# Patient Record
Sex: Male | Born: 1935 | Race: White | Hispanic: No | Marital: Married | Smoking: Never smoker
Health system: Southern US, Community
[De-identification: ages and names within clinical notes are randomized; demographics above are authoritative.]

## PROBLEM LIST (undated history)

## (undated) DIAGNOSIS — I2510 Atherosclerotic heart disease of native coronary artery without angina pectoris: Secondary | ICD-10-CM

## (undated) DIAGNOSIS — I10 Essential (primary) hypertension: Secondary | ICD-10-CM

## (undated) HISTORY — PX: REPLACEMENT TOTAL KNEE: SUR1224

## (undated) HISTORY — PX: OTHER SURGICAL HISTORY: SHX169

## (undated) HISTORY — PX: CARDIAC SURGERY: SHX584

## (undated) HISTORY — PX: KNEE ARTHROSCOPY: SUR90

---

## 2014-09-18 ENCOUNTER — Encounter (HOSPITAL_BASED_OUTPATIENT_CLINIC_OR_DEPARTMENT_OTHER): Payer: Self-pay | Admitting: *Deleted

## 2014-09-18 ENCOUNTER — Emergency Department (HOSPITAL_BASED_OUTPATIENT_CLINIC_OR_DEPARTMENT_OTHER)
Admission: EM | Admit: 2014-09-18 | Discharge: 2014-09-18 | Disposition: A | Discharge status: Other Acute Inpatient Hospital | Payer: Medicare Other | Attending: Emergency Medicine | Admitting: Emergency Medicine

## 2014-09-18 DIAGNOSIS — Y998 Other external cause status: Secondary | ICD-10-CM | POA: Diagnosis not present

## 2014-09-18 DIAGNOSIS — Z9889 Other specified postprocedural states: Secondary | ICD-10-CM | POA: Insufficient documentation

## 2014-09-18 DIAGNOSIS — R609 Edema, unspecified: Secondary | ICD-10-CM | POA: Insufficient documentation

## 2014-09-18 DIAGNOSIS — Y9289 Other specified places as the place of occurrence of the external cause: Secondary | ICD-10-CM | POA: Insufficient documentation

## 2014-09-18 DIAGNOSIS — R008 Other abnormalities of heart beat: Secondary | ICD-10-CM | POA: Diagnosis not present

## 2014-09-18 DIAGNOSIS — R0989 Other specified symptoms and signs involving the circulatory and respiratory systems: Secondary | ICD-10-CM | POA: Diagnosis present

## 2014-09-18 DIAGNOSIS — T18128A Food in esophagus causing other injury, initial encounter: Secondary | ICD-10-CM

## 2014-09-18 DIAGNOSIS — T18120A Food in esophagus causing compression of trachea, initial encounter: Secondary | ICD-10-CM | POA: Insufficient documentation

## 2014-09-18 DIAGNOSIS — I10 Essential (primary) hypertension: Secondary | ICD-10-CM | POA: Insufficient documentation

## 2014-09-18 DIAGNOSIS — K222 Esophageal obstruction: Secondary | ICD-10-CM

## 2014-09-18 DIAGNOSIS — X58XXXA Exposure to other specified factors, initial encounter: Secondary | ICD-10-CM | POA: Diagnosis not present

## 2014-09-18 DIAGNOSIS — Y9389 Activity, other specified: Secondary | ICD-10-CM | POA: Diagnosis not present

## 2014-09-18 DIAGNOSIS — I2510 Atherosclerotic heart disease of native coronary artery without angina pectoris: Secondary | ICD-10-CM | POA: Diagnosis not present

## 2014-09-18 HISTORY — DX: Atherosclerotic heart disease of native coronary artery without angina pectoris: I25.10

## 2014-09-18 HISTORY — DX: Essential (primary) hypertension: I10

## 2014-09-18 NOTE — ED Notes (Signed)
Patient states that he was eating and suddenly was unable to swallow. Son was with him and states that everything he tried to eat or drink would come back up, but he was not vomiting. Patient states that he also has SOB

## 2014-09-18 NOTE — ED Provider Notes (Signed)
CSN: [id number]     Arrival date & time 09/18/14  1428 History  This chart was scribed for Tilden FossaElizabeth Ulanda Tackett, MD by Roxy Cedarhandni Bhalodia, ED Scribe. This patient was seen in room MH09/MH09 and the patient's care was started at 3:19 PM.   Chief Complaint  Patient presents with  . Choking   Patient is a 80 y.o. male presenting with shortness of breath. The history is provided by the patient. No language interpreter was used.  Shortness of Breath Onset quality:  Sudden Timing:  Rare Progression:  Improving Chronicity:  New Context comment:  Choking Relieved by:  Nothing Worsened by:  Nothing tried Ineffective treatments:  None tried Associated symptoms: no vomiting    HPI Comments: Colin Keith is a 80 y.o. male with a history of hypertension and CAD, who presents to the Emergency Department complaining of sudden SOB and a choking episode that occurred earlier today at 1:30PM. Patient states that he was eating roast pork and macaroni and cheese and suddenly was unable to swallow his food. Patient's son with him during incident. Per son, patient was not able to keep anything he ate or drank down. He denies associated episodes of emesis. He states he feels like "something is stuck down in my throat near my chest". He also feels mild associated dizziness. He states that he has had similar episodes of choking in the past but states that his symptoms today are worse than usual. He states he currently takes Coumadin blood thinner medication.  Past Medical History  Diagnosis Date  . Hypertension   . Coronary artery disease    Past Surgical History  Procedure Laterality Date  . Stents    . Cardiac surgery    . Knee arthroscopy    . Replacement total knee     No family history on file. History  Substance Use Topics  . Smoking status: Never Smoker   . Smokeless tobacco: Not on file  . Alcohol Use: No   Review of Systems  Respiratory: Positive for choking and shortness of breath.    Gastrointestinal: Negative for vomiting.  All other systems reviewed and are negative.  Allergies  Review of patient's allergies indicates no known allergies.  Home Medications   Prior to Admission medications   Not on File   Triage Vitals: BP 152/92 mmHg  Pulse 79  Temp(Src) 97.7 F (36.5 C) (Oral)  Resp 24  Ht 5\' 6"  (1.676 m)  Wt 212 lb (96.163 kg)  BMI 34.23 kg/m2  SpO2 100%  Physical Exam  Constitutional: He is oriented to person, place, and time. He appears well-developed and well-nourished.  HENT:  Head: Normocephalic and atraumatic.  Cardiovascular: Normal rate and normal heart sounds.  An irregular rhythm present.  No murmur heard.    Pulmonary/Chest: Effort normal and breath sounds normal. No respiratory distress.  No respiratory distress noted.  Abdominal: Soft. Bowel sounds are normal. There is no tenderness. There is no rebound and no guarding.  Musculoskeletal: He exhibits edema. He exhibits no tenderness.  1+ edema in bilateral lower extremities  Neurological: He is alert and oriented to person, place, and time.  Skin: Skin is warm and dry.  Psychiatric: He has a normal mood and affect. His behavior is normal.  Nursing note and vitals reviewed.  ED Course  Procedures (including critical care time)  DIAGNOSTIC STUDIES: Oxygen Saturation is 100% on RA, normal by my interpretation.    COORDINATION OF CARE: 3:27 PM-  [facility]. Discussed plans [facility] patient request due to food impaction. Pt advised of plan for treatment and pt agrees.  Labs Review Labs Reviewed - No data to display  Imaging Review No results found.   EKG Interpretation None     MDM   Final diagnoses:  Esophageal obstruction due to food impaction    Patient here for evaluation of food stuck in throat. History is dictation consistent with esophageal food impaction. Patient without any respiratory  distress currently or history of recent respiratory distress. [facility]. Discussed with [facility] and ED [facility] who will see patient in transfer.  I personally performed the services described in this documentation, which was scribed in my presence. The recorded information has been reviewed and is accurate.  Tilden FossaElizabeth Phylisha Dix, MD 09/18/14 2250

## 2014-09-18 NOTE — ED Notes (Signed)
Pt asked MD if he could drank one more drank of Water before transferring. OK per MD. Pt given one sip of water and was unable to keep down. Pt also brady down to 30's VS returned back to normal. HR 78, RR 20, SPO2 100% on RA. No other complications noted. MD aware.

## 2017-09-19 ENCOUNTER — Emergency Department (HOSPITAL_BASED_OUTPATIENT_CLINIC_OR_DEPARTMENT_OTHER)
Admission: EM | Admit: 2017-09-19 | Discharge: 2017-09-20 | Disposition: A | Discharge status: Other Acute Inpatient Hospital | Payer: Medicare Other | Attending: Emergency Medicine | Admitting: Emergency Medicine

## 2017-09-19 ENCOUNTER — Emergency Department (HOSPITAL_BASED_OUTPATIENT_CLINIC_OR_DEPARTMENT_OTHER): Payer: Medicare Other

## 2017-09-19 ENCOUNTER — Encounter (HOSPITAL_BASED_OUTPATIENT_CLINIC_OR_DEPARTMENT_OTHER): Payer: Self-pay | Admitting: *Deleted

## 2017-09-19 ENCOUNTER — Other Ambulatory Visit: Payer: Self-pay

## 2017-09-19 DIAGNOSIS — Z79899 Other long term (current) drug therapy: Secondary | ICD-10-CM | POA: Diagnosis not present

## 2017-09-19 DIAGNOSIS — R0602 Shortness of breath: Secondary | ICD-10-CM | POA: Insufficient documentation

## 2017-09-19 DIAGNOSIS — Z7901 Long term (current) use of anticoagulants: Secondary | ICD-10-CM | POA: Insufficient documentation

## 2017-09-19 DIAGNOSIS — I4891 Unspecified atrial fibrillation: Secondary | ICD-10-CM | POA: Insufficient documentation

## 2017-09-19 DIAGNOSIS — I509 Heart failure, unspecified: Secondary | ICD-10-CM | POA: Diagnosis not present

## 2017-09-19 DIAGNOSIS — I2510 Atherosclerotic heart disease of native coronary artery without angina pectoris: Secondary | ICD-10-CM | POA: Diagnosis not present

## 2017-09-19 DIAGNOSIS — R2241 Localized swelling, mass and lump, right lower limb: Secondary | ICD-10-CM | POA: Diagnosis present

## 2017-09-19 DIAGNOSIS — I110 Hypertensive heart disease with heart failure: Secondary | ICD-10-CM | POA: Diagnosis not present

## 2017-09-19 LAB — BASIC METABOLIC PANEL
ANION GAP: 5 (ref 5–15)
BUN: 34 mg/dL — ABNORMAL HIGH (ref 6–20)
CHLORIDE: 98 mmol/L — AB (ref 101–111)
CO2: 33 mmol/L — AB (ref 22–32)
Calcium: 8.8 mg/dL — ABNORMAL LOW (ref 8.9–10.3)
Creatinine, Ser: 1.47 mg/dL — ABNORMAL HIGH (ref 0.61–1.24)
GFR calc non Af Amer: 40 mL/min — ABNORMAL LOW (ref >60)
GFR, EST AFRICAN AMERICAN: 46 mL/min — AB (ref >60)
GLUCOSE: 111 mg/dL — AB (ref 65–99)
Potassium: 4.1 mmol/L (ref 3.5–5.1)
Sodium: 136 mmol/L (ref 135–145)

## 2017-09-19 LAB — CBC WITH DIFFERENTIAL/PLATELET
Basophils Absolute: 0.1 10*3/uL (ref 0.0–0.1)
Basophils Relative: 2 %
Eosinophils Absolute: 0.1 10*3/uL (ref 0.0–0.7)
Eosinophils Relative: 3 %
HCT: 29.5 % — ABNORMAL LOW (ref 39.0–52.0)
Hemoglobin: 9.2 g/dL — ABNORMAL LOW (ref 13.0–17.0)
Lymphocytes Relative: 13 %
Lymphs Abs: 0.6 10*3/uL — ABNORMAL LOW (ref 0.7–4.0)
MCH: 28 pg (ref 26.0–34.0)
MCHC: 31.2 g/dL (ref 30.0–36.0)
MCV: 89.7 fL (ref 78.0–100.0)
MONO ABS: 0.4 10*3/uL (ref 0.1–1.0)
MONOS PCT: 10 %
NEUTROS ABS: 3.3 10*3/uL (ref 1.7–7.7)
NEUTROS PCT: 72 %
Platelets: 156 10*3/uL (ref 150–400)
RBC: 3.29 MIL/uL — ABNORMAL LOW (ref 4.22–5.81)
RDW: 16 % — AB (ref 11.5–15.5)
WBC: 4.5 10*3/uL (ref 4.0–10.5)

## 2017-09-19 LAB — BRAIN NATRIURETIC PEPTIDE: B Natriuretic Peptide: 343.7 pg/mL — ABNORMAL HIGH (ref 0.0–100.0)

## 2017-09-19 LAB — PROTIME-INR
INR: 3.33
Prothrombin Time: 33.5 seconds — ABNORMAL HIGH (ref 11.4–15.2)

## 2017-09-19 LAB — TROPONIN I: Troponin I: <0.03 ng/mL (ref <0.03)

## 2017-09-19 MED ORDER — FUROSEMIDE 10 MG/ML IJ SOLN
40.0000 mg | Freq: Once | INTRAMUSCULAR | Status: AC
  Administered 2017-09-19: 40 mg via INTRAVENOUS
  Filled 2017-09-19: qty 4

## 2017-09-19 NOTE — ED Triage Notes (Signed)
Right lower leg is swollen red and painful.

## 2017-09-19 NOTE — ED Notes (Addendum)
Attempted to call for report 3 times.  Phone just rang.   Phone number verified with Diplomatic Services operational officersecretary.    One staff finally answered the phone and she stated that the number I called was a patient's room [phone]).  She gave me the right number. [phone].

## 2017-09-19 NOTE — ED Provider Notes (Signed)
Medical screening examination/treatment/procedure(s) were conducted as a shared visit with non-physician practitioner(s) and myself.  I personally evaluated the patient during the encounter.   EKG Interpretation  Date/Time:  Friday September 19 2017 18:21:40 EST Ventricular Rate:  68 PR Interval:    QRS Duration: 91 QT Interval:  387 QTC Calculation: 412 R Axis:   35 Text Interpretation:  Atrial fibrillation Anterior infarct, old no prior EKG  Confirmed by Crista CurbLiu, Mumtaz Lovins [phone] on 09/19/2017 11:07:7339 PM      83 year old male with history of CAD, chronic atrial fibrillation on Coumadin with right lower leg swelling.  Symptoms have been ongoing for a few days, with swelling right worse than left.  Yesterday noticed difficulty falling asleep at night when he laid back after waking up to use the restroom.  He has not had chest pain, fevers, nausea or vomiting, dyspnea on exertion or fatigue.  Patient is nontoxic in no acute distress.  Does have hypoxia 87-91% on room air.  With right greater than left lower extremity edema.  Doubt DVT given INR 3.33.  Ultrasound of the lower extremities were obtained, and negative for DVT. Given O2 requirement concerned for CHF. BNP mildly elevated. Negative troponin. No ischemic EKG changes. CXR visualized and shows interstitial pulmonary edema.  He is given Lasix.  Given new oxygen requirement in the ED patient will be admitted for CHF.  [facility].  Accepted for transfer and admission.   Lavera GuiseLiu, Alverta Caccamo Duo, MD 09/19/17 [phone]

## 2017-09-19 NOTE — ED Provider Notes (Signed)
[facility] EMERGENCY DEPARTMENT Provider Note   CSN: [id number] Arrival date & time: 09/19/17  1728     History   Chief Complaint Chief Complaint  Patient presents with  . Leg Pain    HPI Colin Keith is a 83 y.o. male.  HPI 83 year old Caucasian male past medical history significant for hypertension, CAD, atrial fibrillation currently on Coumadin, CHF that presents to the emergency room for evaluation of right lower leg swelling.  The patient states that his symptoms have been ongoing for the past 6-7 days.  States that the swelling in the right leg is worse states that over the past week he has noticed increased shortness of breath on exertion.  He also reports some orthopnea last night when he went to use the restroom.  Patient denies any associated chest pain fevers, nausea, emesis.  Patient was recently admitted to the hospital and [facility] for pneumonia versus CHF exacerbation.  Patient states that he has been doing well.  Saw his primary care doctor today for his right lower leg swelling and they were concerned T although patient is on anticoagulations and takes it regularly was regular INR checks.  Patient denies any associated urinary symptoms in bowel habits, abdominal pain.  He has not taking her symptoms prior to arrival.  Patient reports taking Lasix as prescribed.  Pt denies any fever, chill, ha, vision changes, lightheadedness, dizziness, congestion, neck pain, cp, cough, abd pain, n/v/d, urinary symptoms, change in bowel habits, melena, hematochezia, lower extremity paresthesias.  Past Medical History:  Diagnosis Date  . Coronary artery disease   . Hypertension     There are no active problems to display for this patient.   Past Surgical History:  Procedure Laterality Date  . CARDIAC SURGERY    . KNEE ARTHROSCOPY    . REPLACEMENT TOTAL KNEE    . stents         Home Medications    Prior to Admission medications     Medication Sig Start Date End Date Taking? Authorizing Provider  atenolol (TENORMIN) 25 MG tablet Take by mouth daily.   Yes [provider]  finasteride (PROSCAR) 5 MG tablet Take 5 mg by mouth daily.   Yes [provider]  furosemide (LASIX) 40 MG tablet Take 40 mg by mouth daily.   Yes [provider]  HYDROcodone-acetaminophen (NORCO/VICODIN) 5-325 MG per tablet Take 1 tablet by mouth every 6 (six) hours as needed for moderate pain.   Yes [provider]  simvastatin (ZOCOR) 40 MG tablet Take 40 mg by mouth daily.   Yes [provider]  terazosin (HYTRIN) 2 MG capsule Take 2 mg by mouth once.   Yes [provider]  warfarin (COUMADIN) 5 MG tablet Take 5 mg by mouth daily.   Yes [provider]    Family History No family history on file.  Social History Social History   Tobacco Use  . Smoking status: Never Smoker  . Smokeless tobacco: Never Used  Substance Use Topics  . Alcohol use: No  . Drug use: Not on file     Allergies   Patient has no known allergies.   Review of Systems Review of Systems  Constitutional: Negative for chills and fever.  HENT: Negative for congestion and sore throat.   Eyes: Negative for visual disturbance.  Respiratory: Positive for shortness of breath. Negative for cough.   Cardiovascular: Positive for leg swelling. Negative for chest pain and palpitations.  Gastrointestinal: Negative for abdominal pain, diarrhea, nausea and vomiting.  Genitourinary: Negative for dysuria, flank pain, frequency, hematuria, scrotal swelling, testicular pain and urgency.  Musculoskeletal: Negative for arthralgias and myalgias.  Skin: Negative for rash.  Neurological: Negative for dizziness, syncope, weakness, light-headedness, numbness and headaches.  Psychiatric/Behavioral: Negative for sleep disturbance. The patient is not nervous/anxious.      Physical Exam Updated Vital Signs BP (!) 155/98    Pulse 89   Temp 97.9 F (36.6 C) (Oral)   Resp (!) 33   Ht 5\' 6"  (1.676 m)   Wt 70.5 kg (155 lb 6 oz)   SpO2 95%   BMI 25.08 kg/m   Physical Exam  Constitutional: He is oriented to person, place, and time. He appears well-developed and well-nourished.  Non-toxic appearance. No distress.  HENT:  Head: Normocephalic and atraumatic.  Nose: Nose normal.  Mouth/Throat: Oropharynx is clear and moist.  Eyes: Conjunctivae are normal. Pupils are equal, round, and reactive to light. Right eye exhibits no discharge. Left eye exhibits no discharge.  Neck: Normal range of motion. Neck supple. No JVD present. No tracheal deviation present.  Cardiovascular: Normal rate, normal heart sounds and intact distal pulses. An irregularly irregular rhythm present. Exam reveals no gallop and no friction rub.  No murmur heard. Pulmonary/Chest: Effort normal and breath sounds normal. No stridor. No respiratory distress. He has no decreased breath sounds. He has no rales. He exhibits no tenderness.  She is hypoxic on room air 89%.  Mild tachypnea noted.  Crackles noted in all long fileds  Abdominal: Soft. Bowel sounds are normal. He exhibits no distension. There is no tenderness. There is no rebound and no guarding.  Musculoskeletal: Normal range of motion.  No lower extremity edema or calf tenderness.  Patient does have 3+ pitting edema to the right lower extremities up to the level of the knee.  Mild erythema noted.  No warmth or purulent drainage.  Patient has 1-2+ pitting edema to the left lower extremity.  DP pulses are 2+ bilaterally.  Sensation intact.  Cap refill is normal.  Lymphadenopathy:    He has no cervical adenopathy.  Neurological: He is alert and oriented to person, place, and time.  Skin: Skin is warm and dry. Capillary refill takes less than 2 seconds. He is not diaphoretic.  Psychiatric: His behavior is normal. Judgment and thought content normal.  Nursing note and vitals reviewed.    ED  Treatments / Results  Labs (all labs ordered are listed, but only abnormal results are displayed) Labs Reviewed  BASIC METABOLIC PANEL - Abnormal; Notable for the following components:      Result Value   Chloride 98 (*)    CO2 33 (*)    Glucose, Bld 111 (*)    BUN 34 (*)    Creatinine, Ser 1.47 (*)    Calcium 8.8 (*)    GFR calc non Af Amer 40 (*)    GFR calc Af Amer 46 (*)    All other components within normal limits  CBC WITH DIFFERENTIAL/PLATELET - Abnormal; Notable for the following components:   RBC 3.29 (*)    Hemoglobin 9.2 (*)    HCT 29.5 (*)    RDW 16.0 (*)    Lymphs Abs 0.6 (*)    All other components within normal limits  PROTIME-INR - Abnormal; Notable for the following components:   Prothrombin Time 33.5 (*)    All other components within normal limits  BRAIN NATRIURETIC PEPTIDE - Abnormal;  Notable for the following components:   B Natriuretic Peptide 343.7 (*)    All other components within normal limits  TROPONIN I    EKG  EKG Interpretation  Date/Time:  Friday September 19 2017 18:21:40 EST Ventricular Rate:  68 PR Interval:    QRS Duration: 91 QT Interval:  387 QTC Calculation: 412 R Axis:   35 Text Interpretation:  Atrial fibrillation Anterior infarct, old no prior EKG  Confirmed by Crista Curb [phone]) on 09/19/2017 11:07:39 PM       Radiology Dg Chest 2 View  Result Date: 09/19/2017 CLINICAL DATA:  Shortness of breath and lower extremity swelling. EXAM: CHEST  2 VIEW COMPARISON:  07/30/2017 FINDINGS: Stable mild cardiomegaly. New diffuse pulmonary interstitial infiltrates, consistent with interstitial edema. No evidence of pulmonary consolidation. Right pleural thickening and multiple right rib fracture deformities are stable. IMPRESSION: New diffuse pulmonary interstitial infiltrates, consistent with pulmonary edema due to congestive heart failure. Stable cardiomegaly. Electronically Signed   By: Myles Rosenthal M.D.   On: 09/19/2017 20:23   US Venous  Img Lower Unilateral Right  Result Date: 09/19/2017 CLINICAL DATA:  RIGHT lower leg swelling and redness for 2-3 weeks. EXAM: RIGHT LOWER EXTREMITY VENOUS DOPPLER ULTRASOUND TECHNIQUE: Gray-scale sonography with graded compression, as well as color Doppler and duplex ultrasound were performed to evaluate the lower extremity deep venous systems from the level of the common femoral vein and including the common femoral, femoral, profunda femoral, popliteal and calf veins including the posterior tibial, peroneal and gastrocnemius veins when visible. The superficial great saphenous vein was also interrogated. Spectral Doppler was utilized to evaluate flow at rest and with distal augmentation maneuvers in the common femoral, femoral and popliteal veins. COMPARISON:  None. FINDINGS: Contralateral Common Femoral Vein: Respiratory phasicity is normal and symmetric with the symptomatic side. No evidence of thrombus. Normal compressibility. Common Femoral Vein: No evidence of thrombus. Normal compressibility, respiratory phasicity and response to augmentation. Saphenofemoral Junction: No evidence of thrombus. Normal compressibility and flow on color Doppler imaging. Profunda Femoral Vein: No evidence of thrombus. Normal compressibility and flow on color Doppler imaging. Femoral Vein: Limited visualization of the distal femoral vein. No evidence of thrombus. Normal compressibility, respiratory phasicity and response to augmentation. Popliteal Vein: No evidence of thrombus. Normal compressibility, respiratory phasicity and response to augmentation. Calf Veins: Limited visualization of the calf veins due to edema. Superficial Great Saphenous Vein: No evidence of thrombus. Normal compressibility. Venous Reflux:  None. Other Findings:  Calf edema. IMPRESSION: No evidence of deep venous thrombosis. Electronically Signed   By: Elsie Stain M.D.   On: 09/19/2017 20:08    Procedures Procedures (including critical care  time)  Medications Ordered in ED Medications  furosemide (LASIX) injection 40 mg (40 mg Intravenous Given 09/19/17 2055)     Initial Impression / Assessment and Plan / ED Course  I have reviewed the triage vital signs and the nursing notes.  Pertinent labs & imaging results that were available during my care of the patient were reviewed by me and considered in my medical decision making (see chart for details).     Patient presents to the ED for evaluation of right lower leg swelling and shortness of breath with exertion and orthopnea.  Patient noted to be satting 87 to patient does have swelling to his right lower extremity with some associated erythema.  Patient is currently on Coumadin for his atrial fibrillation.  I have low suspicion for DVT given therapeutic INR of 3.  However  DVT study was ordered that showed no associated DVT.  Patient has required an O2 requirement of 2 L and satting in the high percent.  Patient was satting 88% on room air when I went to evaluate patient.  Patient has no ischemic EKG changes.  He is noted to be in atrial fibrillation with history of same.  I-STAT troponin was negative.  Chest x-ray shows interstitial pulmonary edema that is new.  Patient's BNP is mildly elevated.  All other lab work is at patient's baseline.  Mild elevation in his creatinine.  Patient given a dose of Lasix in the ED.  Given the new oxygen requirement feel the patient would need admission to the hospital for CHF exacerbation.  Family and patient prefer to be transferred over to [facility] where he receives his care.  Patient's presentation does not seem consistent with ACS, dissection, pneumonia, PE.  Patient was recently admitted to [facility] regional in October for pneumonia or CHF exacerbation.  I did speak with Dr. Anselmo Rod with [facility] hospital team who accepts patient in transfer.  Patient was transferred by [facility] to [facility].  Son at  bedside is very upset about the length transport time.  Have discussed this is the procedure that we follow.  Patient at this time has EMS in route to pick patient up to transport him.  Patient remains hemodynamic stable on 2 L of oxygen and satting at 97% on room air.  He appears comfortable in the room.  Updated family and patient on disposition.  Patient was also seen and evaluated by attending Dr. Verdie Mosher who was agreeable with the above plan.  Final Clinical Impressions(s) / ED Diagnoses   Final diagnoses:  Acute on chronic congestive heart failure, unspecified heart failure type [facility])    ED Discharge Orders    None       Rise Mu, PA-C 09/20/17 0001    Lavera Guise, MD 09/20/17 [phone]

## 2017-09-19 NOTE — ED Notes (Signed)
ED Provider at bedside. 

## 2018-09-18 IMAGING — DX DG CHEST 2V
2 series · 2 of 2 positions shown · non-contrast
Comparison: 07/30/2017

CLINICAL DATA: Shortness of breath and lower extremity swelling.

EXAM:
CHEST  2 VIEW

[chest lat]
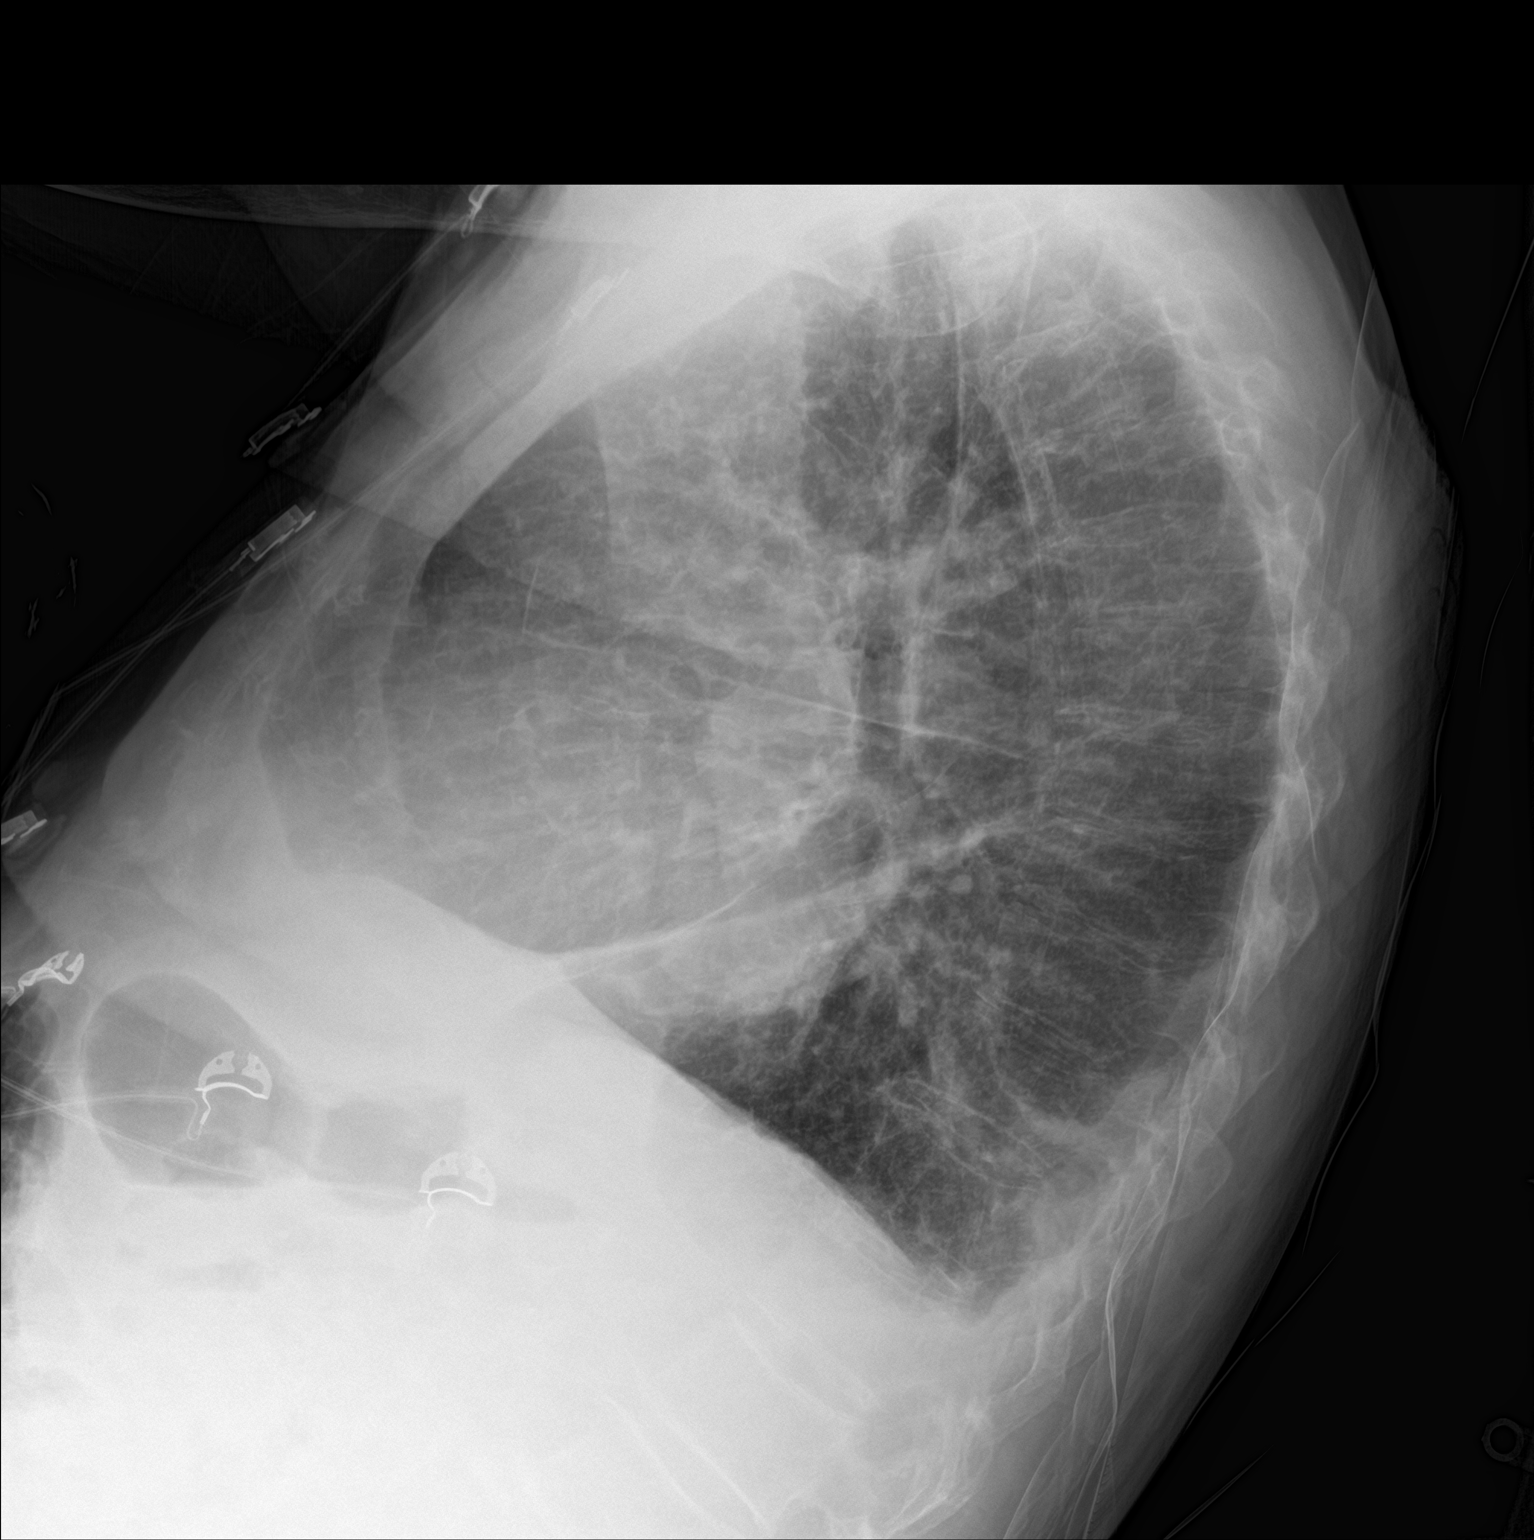

[chest ap]
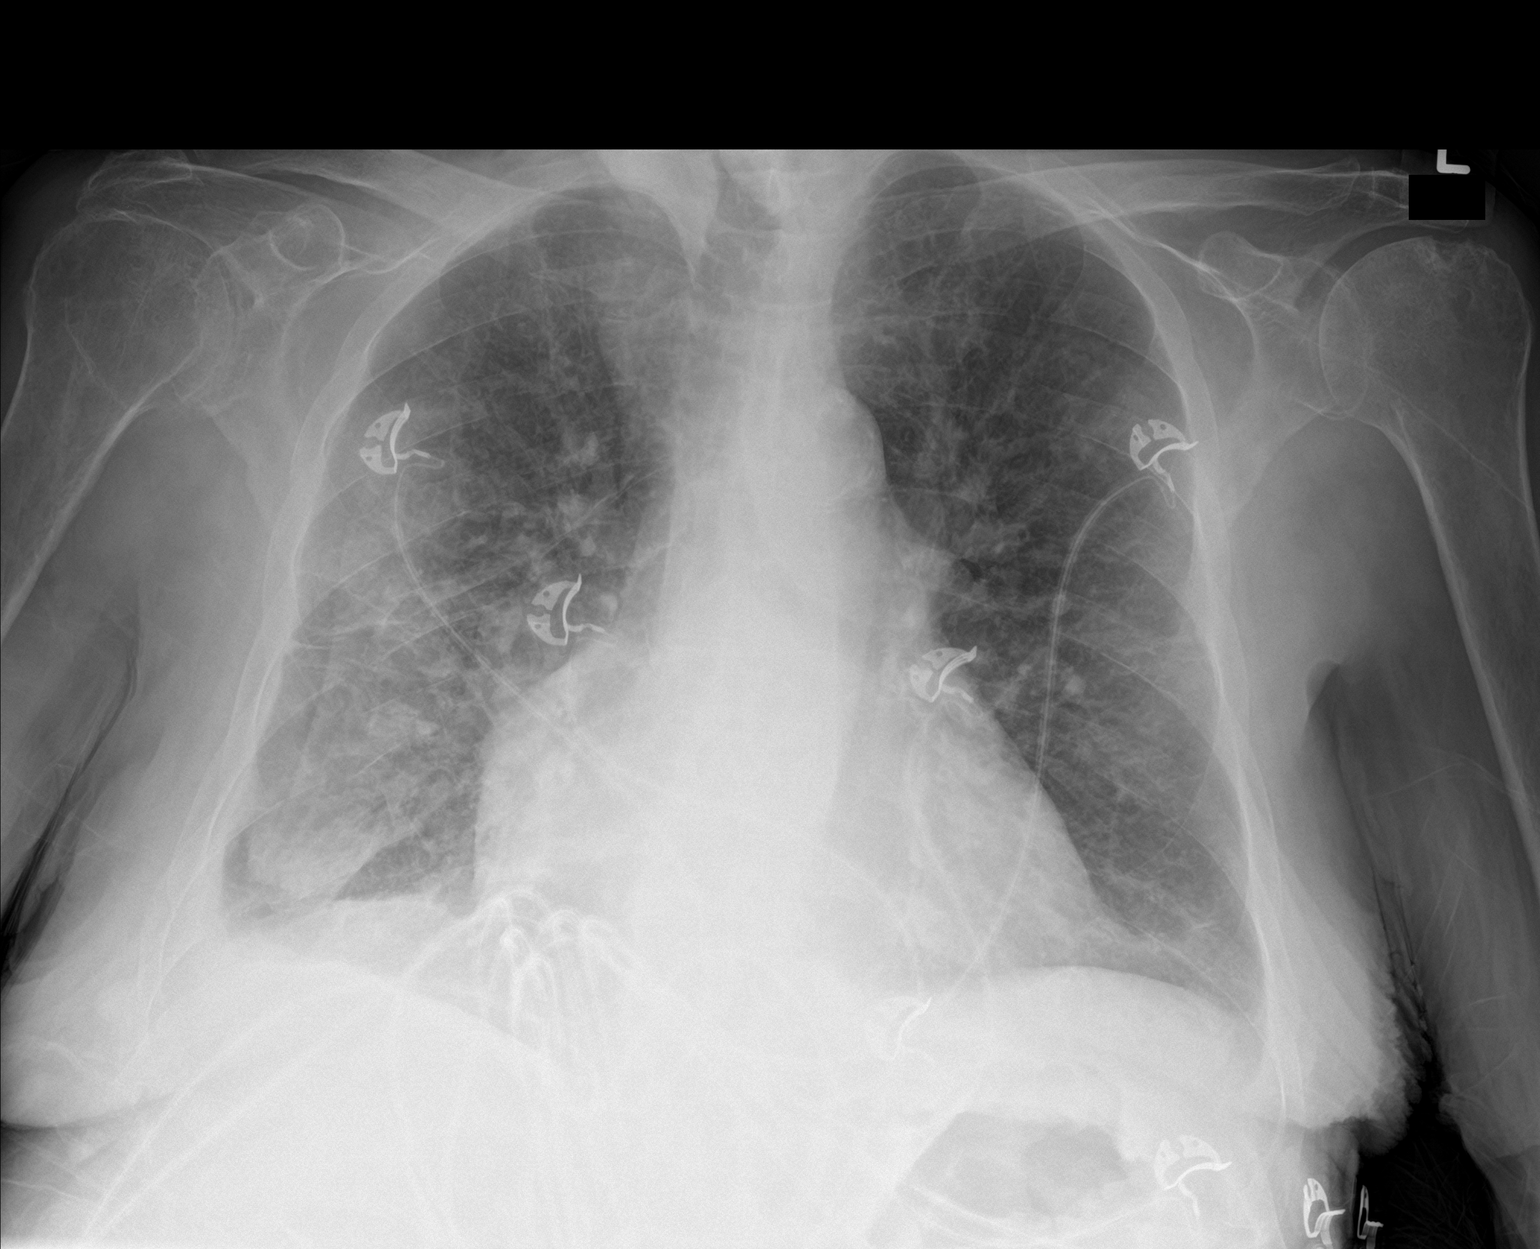

[2 of 2 positions shown; findings below may reference images not displayed]

FINDINGS: Stable mild cardiomegaly. New diffuse pulmonary interstitial
infiltrates, consistent with interstitial edema. No evidence of
pulmonary consolidation. Right pleural thickening and multiple right
rib fracture deformities are stable.
IMPRESSION: New diffuse pulmonary interstitial infiltrates, consistent with
pulmonary edema due to congestive heart failure. Stable
cardiomegaly.

## 2018-10-19 IMAGING — US US EXTREM LOW VENOUS*R*
1 series · 13 of 24 positions shown · non-contrast
Comparison: None.

CLINICAL DATA: RIGHT lower leg swelling and redness for 2-3 weeks.



[Series 1: us extrem low venous*right* · 0.08mm/px · 13 of 32 slices shown]
[im 1/32]
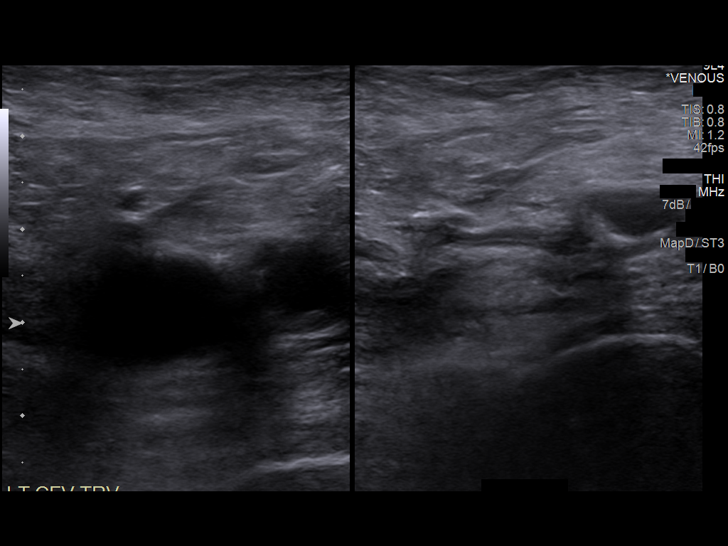
[im 3/32]
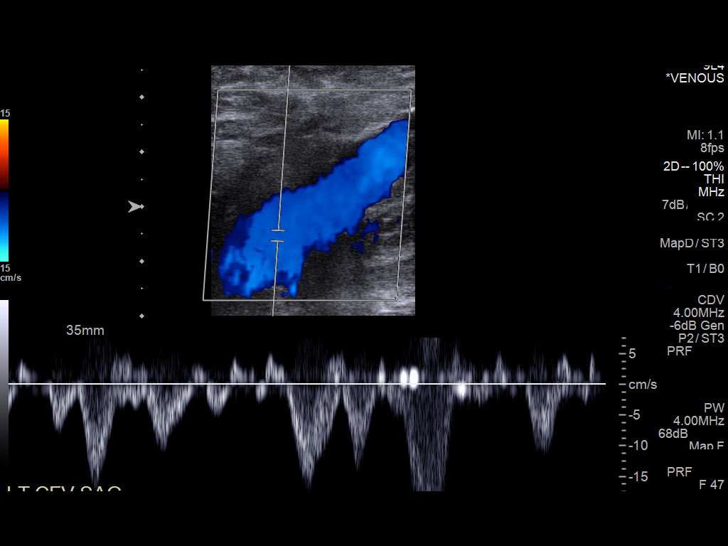
[im 6/32]
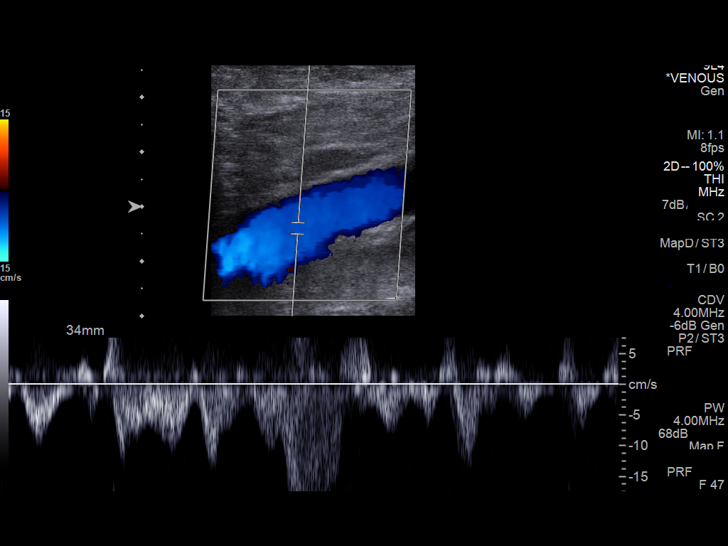
[im 9/32]
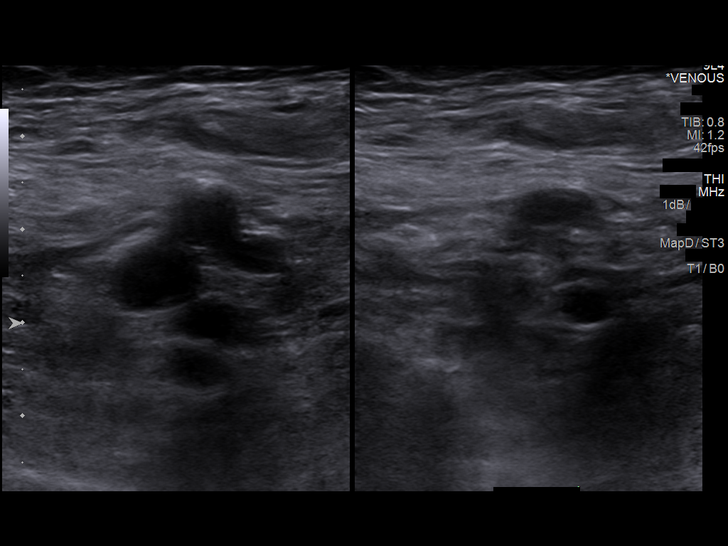
[im 11/32]
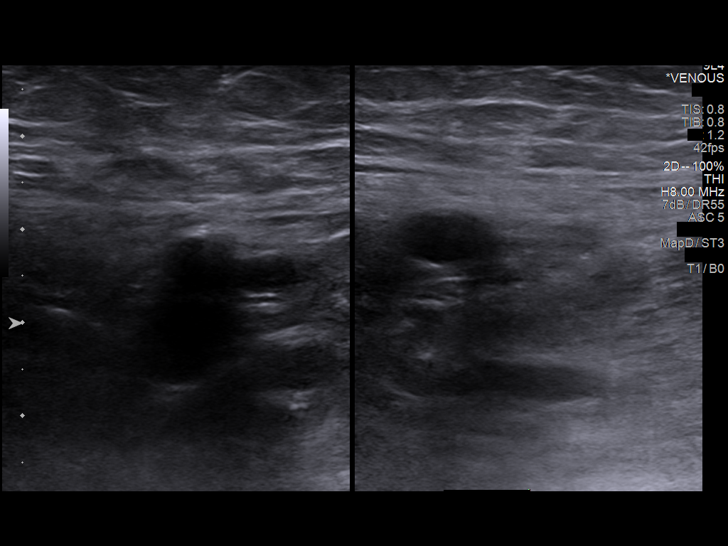
[im 14/32]
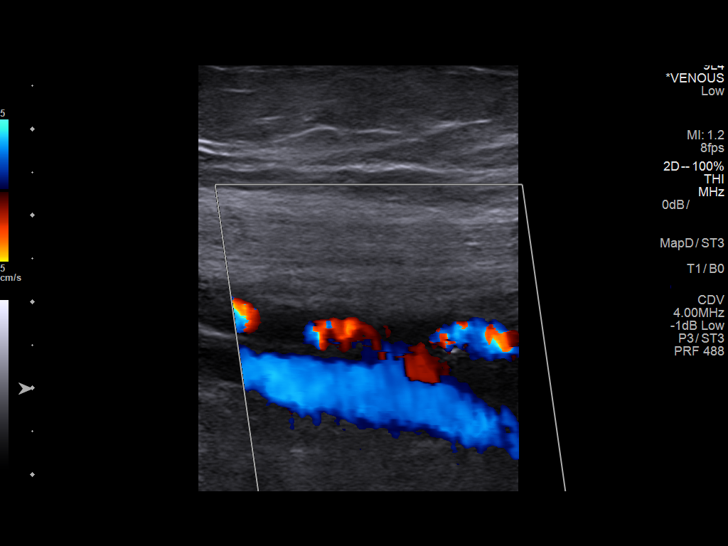
[im 17/32]
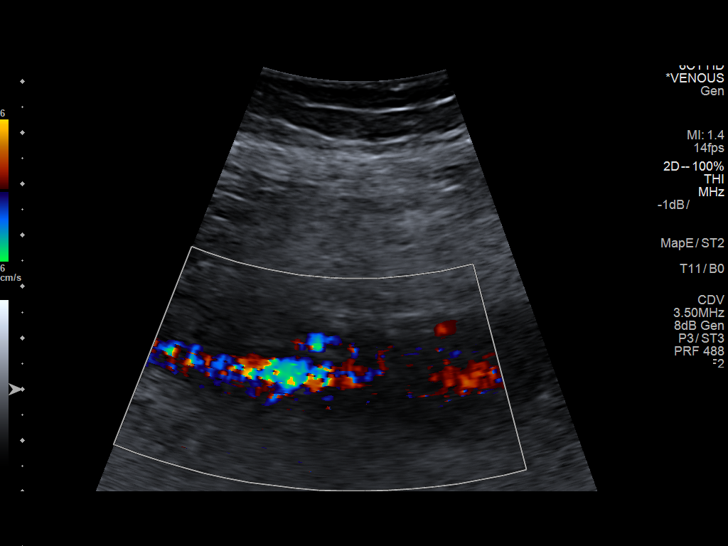
[im 18/32]
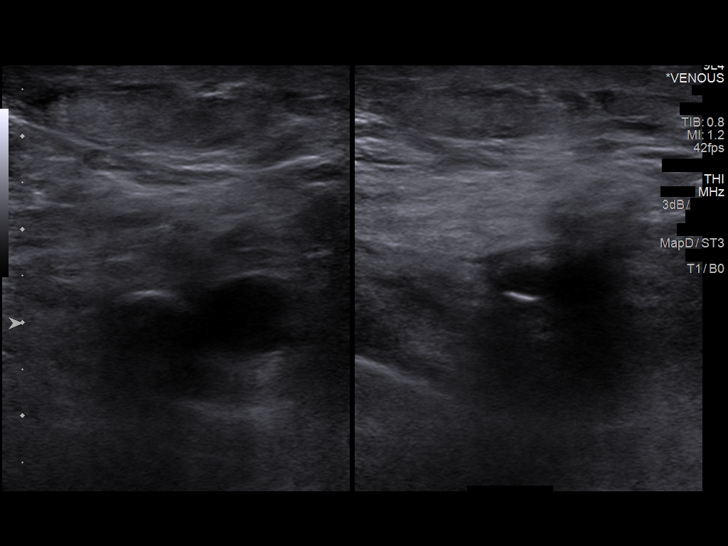
[im 21/32]
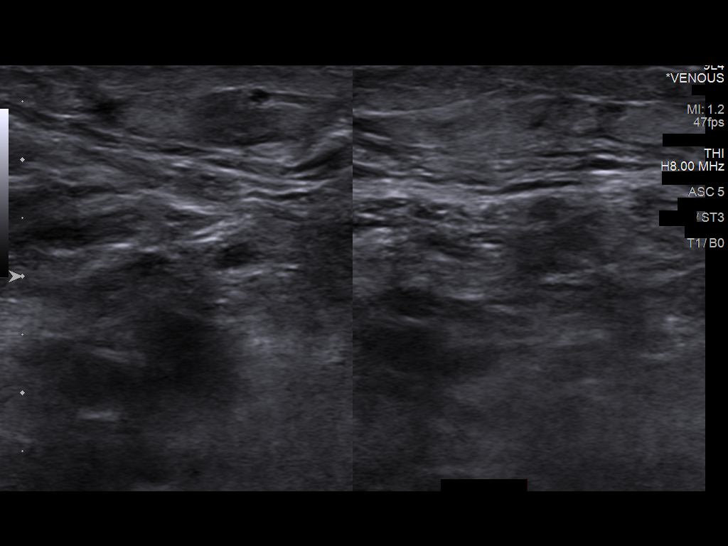
[im 23/32]
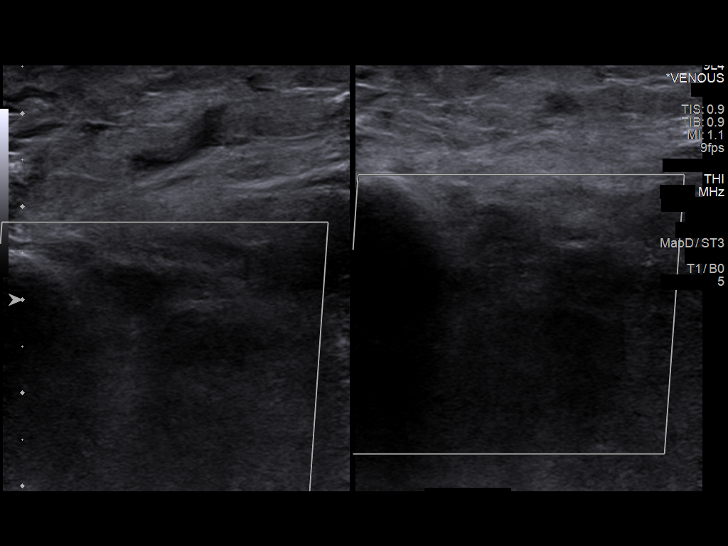
[im 26/32]
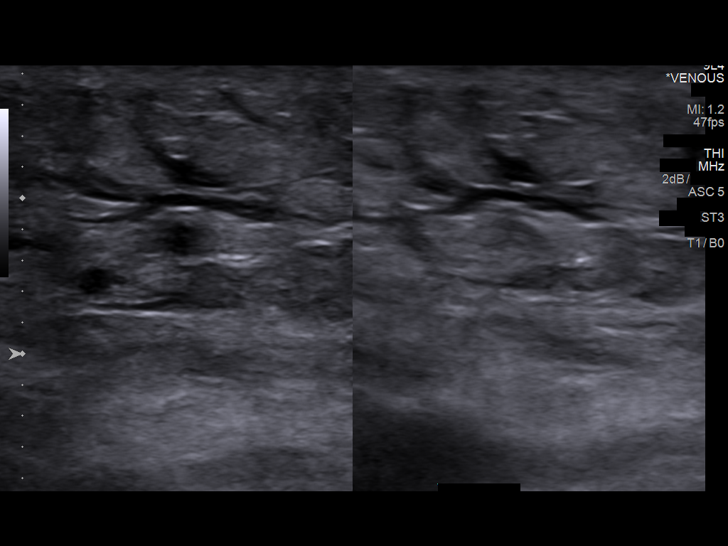
[im 29/32]
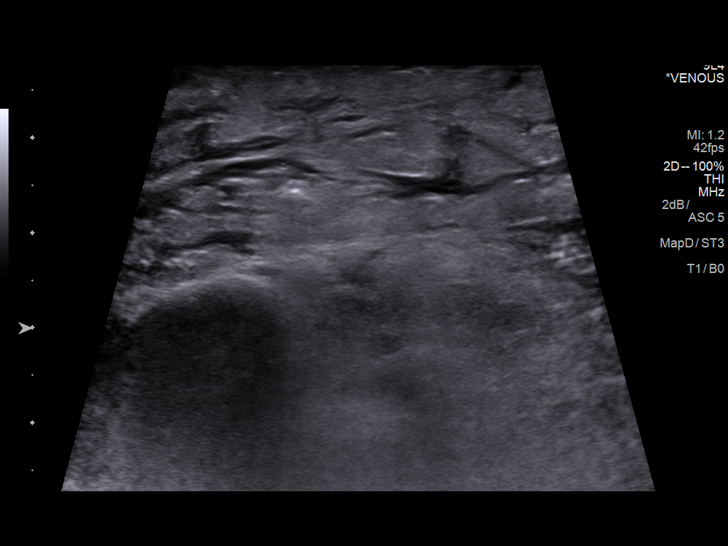
[im 32/32]
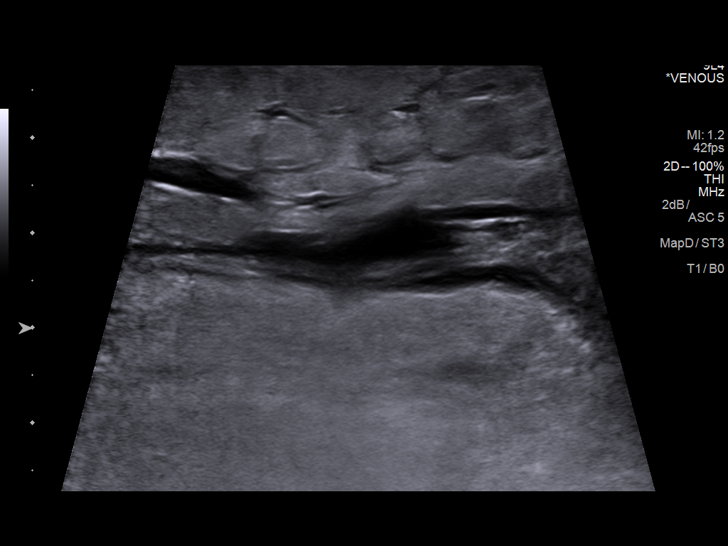

[13 of 24 positions shown; findings below may reference images not displayed]

FINDINGS: Contralateral Common Femoral Vein: Respiratory phasicity is normal
and symmetric with the symptomatic side. No evidence of thrombus.
Normal compressibility.

Common Femoral Vein: No evidence of thrombus. Normal
compressibility, respiratory phasicity and response to augmentation.

Saphenofemoral Junction: No evidence of thrombus. Normal
compressibility and flow on color Doppler imaging.

Profunda Femoral Vein: No evidence of thrombus. Normal
compressibility and flow on color Doppler imaging.

Femoral Vein: Limited visualization of the distal femoral vein. No
evidence of thrombus. Normal compressibility, respiratory phasicity
and response to augmentation.

Popliteal Vein: No evidence of thrombus. Normal compressibility,
respiratory phasicity and response to augmentation.

Calf Veins: Limited visualization of the calf veins due to edema.

Superficial Great Saphenous Vein: No evidence of thrombus. Normal
compressibility.

Venous Reflux:  None.

Other Findings:  Calf edema.
IMPRESSION: No evidence of deep venous thrombosis.

## 2019-02-19 DEATH — deceased
